# Patient Record
Sex: Female | Born: 1955 | Race: White | Hispanic: No | Marital: Married | Smoking: Current every day smoker
Health system: Southern US, Community
[De-identification: ages and names within clinical notes are randomized; demographics above are authoritative.]

## PROBLEM LIST (undated history)

## (undated) DIAGNOSIS — E119 Type 2 diabetes mellitus without complications: Secondary | ICD-10-CM

---

## 1983-04-25 HISTORY — PX: TUBAL LIGATION: SHX77

## 1992-04-24 HISTORY — PX: BREAST SURGERY: SHX581

## 2008-04-24 HISTORY — PX: CHOLECYSTECTOMY: SHX55

## 2018-01-05 ENCOUNTER — Other Ambulatory Visit: Payer: Self-pay

## 2018-01-05 ENCOUNTER — Emergency Department (HOSPITAL_COMMUNITY)
Admission: EM | Admit: 2018-01-05 | Discharge: 2018-01-06 | Disposition: A | Discharge status: Home / Self Care | Payer: BLUE CROSS/BLUE SHIELD | Attending: Emergency Medicine | Admitting: Emergency Medicine

## 2018-01-05 ENCOUNTER — Encounter (HOSPITAL_COMMUNITY): Payer: Self-pay | Admitting: Emergency Medicine

## 2018-01-05 DIAGNOSIS — W268XXA Contact with other sharp object(s), not elsewhere classified, initial encounter: Secondary | ICD-10-CM | POA: Insufficient documentation

## 2018-01-05 DIAGNOSIS — Y93E8 Activity, other personal hygiene: Secondary | ICD-10-CM | POA: Diagnosis not present

## 2018-01-05 DIAGNOSIS — Y92002 Bathroom of unspecified non-institutional (private) residence single-family (private) house as the place of occurrence of the external cause: Secondary | ICD-10-CM | POA: Insufficient documentation

## 2018-01-05 DIAGNOSIS — S71111A Laceration without foreign body, right thigh, initial encounter: Secondary | ICD-10-CM | POA: Diagnosis not present

## 2018-01-05 DIAGNOSIS — Y999 Unspecified external cause status: Secondary | ICD-10-CM | POA: Diagnosis not present

## 2018-01-05 DIAGNOSIS — Z23 Encounter for immunization: Secondary | ICD-10-CM | POA: Insufficient documentation

## 2018-01-05 DIAGNOSIS — Z79899 Other long term (current) drug therapy: Secondary | ICD-10-CM | POA: Diagnosis not present

## 2018-01-05 DIAGNOSIS — Z7984 Long term (current) use of oral hypoglycemic drugs: Secondary | ICD-10-CM | POA: Insufficient documentation

## 2018-01-05 DIAGNOSIS — E119 Type 2 diabetes mellitus without complications: Secondary | ICD-10-CM | POA: Diagnosis not present

## 2018-01-05 DIAGNOSIS — I83899 Varicose veins of unspecified lower extremities with other complications: Secondary | ICD-10-CM | POA: Diagnosis not present

## 2018-01-05 DIAGNOSIS — F17210 Nicotine dependence, cigarettes, uncomplicated: Secondary | ICD-10-CM | POA: Diagnosis not present

## 2018-01-05 DIAGNOSIS — S79921A Unspecified injury of right thigh, initial encounter: Secondary | ICD-10-CM | POA: Diagnosis present

## 2018-01-05 HISTORY — DX: Type 2 diabetes mellitus without complications: E11.9

## 2018-01-05 MED ORDER — TETANUS-DIPHTH-ACELL PERTUSSIS 5-2.5-18.5 LF-MCG/0.5 IM SUSP
0.5000 mL | Freq: Once | INTRAMUSCULAR | Status: AC
  Administered 2018-01-05: 0.5 mL via INTRAMUSCULAR
  Filled 2018-01-05: qty 0.5

## 2018-01-05 NOTE — ED Provider Notes (Signed)
[facility] Provider Note   CSN: [id number] Arrival date & time: 01/05/18  2213     History   Chief Complaint Chief Complaint  Patient presents with  . Extremity Laceration    HPI Adriana FellowsRebecca James is a 62 y.o. female.  HPI   Adriana James is a 62 y.o. female who presents to the Emergency Department complaining of bleeding from a varicose vein of her right thigh.  She states that she was shaving her legs tonight and "nicked" the area with the razor and was unable to control the bleeding at home.  She applied direct pressure with a towel which helped somewhat.  She denies pain, swelling.  Does not take blood thinners.  Last Td is unknown.     Past Medical History:  Diagnosis Date  . Diabetes mellitus without complication (HCC)     There are no active problems to display for this patient.   Past Surgical History:  Procedure Laterality Date  . BREAST SURGERY  1994   reduction   . CHOLECYSTECTOMY  2010  . TUBAL LIGATION  1985     OB History   None      Home Medications    Prior to Admission medications   Medication Sig Start Date End Date Taking? Authorizing Provider  glyBURIDE-metformin (GLUCOVANCE) 5-500 MG tablet Take 0.5 tablets by mouth 2 (two) times daily. 12/27/17  Yes [provider]  ketoconazole (NIZORAL) 2 % cream Apply 1 application topically daily as needed for irritation.  01/04/18  Yes [provider]  levothyroxine (SYNTHROID, LEVOTHROID) 75 MCG tablet Take 75 mcg by mouth daily before breakfast.  12/27/17  Yes [provider]  lisinopril (PRINIVIL,ZESTRIL) 2.5 MG tablet Take 2.5 mg by mouth daily. 12/27/17  Yes [provider]  LORazepam (ATIVAN) 1 MG tablet Take 1 mg by mouth 2 (two) times daily as needed. 10/16/17  Yes [provider]  pantoprazole (PROTONIX) 40 MG tablet Take 40 mg by mouth daily. 12/27/17  Yes [provider]  pravastatin (PRAVACHOL) 20 MG tablet Take 20 mg by mouth  daily. 12/27/17  Yes [provider]  SM ATHLETES FOOT 1 % cream Apply 1 application topically 2 (two) times daily as needed (for irritation).  11/08/17  Yes [provider]    Family History History reviewed. No pertinent family history.  Social History Social History   Tobacco Use  . Smoking status: Current Every Day Smoker    Packs/day: 1.00    Years: 25.00    Pack years: 25.00    Types: Cigarettes  . Smokeless tobacco: Never Used  Substance Use Topics  . Alcohol use: Yes    Comment: occ  . Drug use: Never     Allergies   Doxycycline   Review of Systems Review of Systems  Constitutional: Negative for fever.  Musculoskeletal: Negative for arthralgias, back pain and myalgias.  Skin: Positive for wound.       Laceration right thigh  Neurological: Negative for dizziness, weakness and numbness.  Hematological: Does not bruise/bleed easily.     Physical Exam Updated Vital Signs BP (!) 150/76 (BP Location: Right Arm)   Pulse (!) 104   Temp 97.8 F (36.6 C) (Oral)   Resp 20   Ht 5\' 2"  (1.575 m)   Wt 81.2 kg   SpO2 96%   BMI 32.74 kg/m   Physical Exam  Constitutional: She appears well-developed and well-nourished. No distress.  HENT:  Head: Atraumatic.  Cardiovascular: Normal rate  and regular rhythm.  Pulmonary/Chest: Effort normal and breath sounds normal. No respiratory distress.  Musculoskeletal: She exhibits no edema or tenderness.  Bleeding from a small varicose vein to the anterior right thigh.  No laceration  Neurological: She is alert. No sensory deficit.  Skin: Skin is warm. Capillary refill takes less than 2 seconds.  Nursing note and vitals reviewed.    ED Treatments / Results  Labs (all labs ordered are listed, but only abnormal results are displayed) Labs Reviewed - No data to display  EKG None  Radiology No results found.  Procedures Procedures (including critical care time)  Quick clot powder applied to the  bleeding site and pressure dressing applied.  Pt tolerated procedure well.    Medications Ordered in ED Medications  Tdap (BOOSTRIX) injection 0.5 mL (has no administration in time range)     Initial Impression / Assessment and Plan / ED Course  I have reviewed the triage vital signs and the nursing notes.  Pertinent labs & imaging results that were available during my care of the patient were reviewed by me and considered in my medical decision making (see chart for details).     Bleeding from a varicosity of the right upper leg, controlled with application of hemostatic powder.   Pt observed in the dept and hemostasis obtained.  Td updated.  She appears appropriate for d/c home.  Return precautions discussed.     Final Clinical Impressions(s) / ED Diagnoses   Final diagnoses:  Bleeding from varicose vein    ED Discharge Orders    None       Pauline Aus, PA-C 01/05/18 2350    Long, Arlyss Repress, MD 01/06/18 [phone]

## 2018-01-05 NOTE — Discharge Instructions (Addendum)
Keep the pressure dressing in place until tomorrow afternoon then gently remove the outer bandage and replace with a band-aid.  Elevate your leg when possible.  Return if needed

## 2018-01-05 NOTE — ED Triage Notes (Signed)
Pt states she cut a small vein on her upper right thigh shaving x 20 mins ago, bleeding controlled at this time

## 2020-03-23 ENCOUNTER — Encounter: Payer: Self-pay | Admitting: Emergency Medicine

## 2020-03-23 ENCOUNTER — Ambulatory Visit
Admission: EM | Admit: 2020-03-23 | Discharge: 2020-03-23 | Disposition: A | Discharge status: Home / Self Care | Payer: BC Managed Care – PPO | Attending: Emergency Medicine | Admitting: Emergency Medicine

## 2020-03-23 ENCOUNTER — Ambulatory Visit (INDEPENDENT_AMBULATORY_CARE_PROVIDER_SITE_OTHER): Payer: BC Managed Care – PPO

## 2020-03-23 ENCOUNTER — Other Ambulatory Visit: Payer: Self-pay

## 2020-03-23 DIAGNOSIS — M79644 Pain in right finger(s): Secondary | ICD-10-CM | POA: Diagnosis not present

## 2020-03-23 MED ORDER — PREDNISONE 10 MG PO TABS: 20.0000 mg | ORAL_TABLET | Freq: Every day | ORAL | 0 refills | Status: AC

## 2020-03-23 MED ORDER — ACETAMINOPHEN 500 MG PO TABS: 500.0000 mg | ORAL_TABLET | Freq: Four times a day (QID) | ORAL | 0 refills | Status: AC | PRN

## 2020-03-23 NOTE — ED Provider Notes (Addendum)
[facility]ARE [facility]ENTER   [id number] 03/23/20 Arrival Time: 1516   Chief Complaint  Patient presents with  . Finger Injury  '  SUBJECTIVE: History from: patient.  Adriana James is a 64 y.o. female who presented to the urgent care with a complaint of right thumb pain for the past 4 days.  Denies any precipitating event, trauma or injury.  She localized the pain to the right thumb.  She describes the pain as constant and achy.  She has tried OTC medications without relief.  Her symptoms are made worse with ROM.  She denies similar symptoms in the past.  Denies chills, fever, nausea, vomiting, diarrhea.    ROS: As per HPI.  All other pertinent ROS negative.      Past Medical History:  Diagnosis Date  . Diabetes mellitus without complication [facility])    Past Surgical History:  Procedure Laterality Date  . BREAST SURGERY  1994   reduction   . CHOLECYSTECTOMY  2010  . TUBAL LIGATION  1985   Allergies  Allergen Reactions  . Doxycycline Rash   No current facility-administered medications on file prior to encounter.   Current Outpatient Medications on File Prior to Encounter  Medication Sig Dispense Refill  . glyBURIDE-metformin (GLUCOVANCE) 5-500 MG tablet Take 0.5 tablets by mouth 2 (two) times daily.  12  . ketoconazole (NIZORAL) 2 % cream Apply 1 application topically daily as needed for irritation.   0  . levothyroxine (SYNTHROID, LEVOTHROID) 75 MCG tablet Take 75 mcg by mouth daily before breakfast.   4  . lisinopril (PRINIVIL,ZESTRIL) 2.5 MG tablet Take 2.5 mg by mouth daily.  4  . LORazepam (ATIVAN) 1 MG tablet Take 1 mg by mouth 2 (two) times daily as needed.  2  . pantoprazole (PROTONIX) 40 MG tablet Take 40 mg by mouth daily.  12  . pravastatin (PRAVACHOL) 20 MG tablet Take 20 mg by mouth daily.  1  . SM ATHLETES FOOT 1 % cream Apply 1 application topically 2 (two) times daily as needed (for irritation).   2   Social History   Socioeconomic History  . Marital status:  Married    Spouse name: Not on file  . Number of children: Not on file  . Years of education: Not on file  . Highest education level: Not on file  Occupational History  . Not on file  Tobacco Use  . Smoking status: Current Every Day Smoker    Packs/day: 1.00    Years: 25.00    Pack years: 25.00    Types: Cigarettes  . Smokeless tobacco: Never Used  Vaping Use  . Vaping Use: Never used  Substance and Sexual Activity  . Alcohol use: Yes    Comment: occ  . Drug use: Never  . Sexual activity: Not Currently  Other Topics Concern  . Not on file  Social History Narrative  . Not on file   Social Determinants of Health   Financial Resource Strain:   . Difficulty of Paying Living Expenses: Not on file  Food Insecurity:   . Worried About Programme researcher, broadcasting/film/video in the Last Year: Not on file  . Ran Out of Food in the Last Year: Not on file  Transportation Needs:   . Lack of Transportation (Medical): Not on file  . Lack of Transportation (Non-Medical): Not on file  Physical Activity:   . Days of Exercise per Week: Not on file  . Minutes of Exercise per Session: Not on file  Stress:   . Feeling of Stress : Not on file  Social Connections:   . Frequency of Communication with Friends and Family: Not on file  . Frequency of Social Gatherings with Friends and Family: Not on file  . Attends Religious Services: Not on file  . Active Member of Clubs or Organizations: Not on file  . Attends Banker Meetings: Not on file  . Marital Status: Not on file  Intimate Partner Violence:   . Fear of Current or Ex-Partner: Not on file  . Emotionally Abused: Not on file  . Physically Abused: Not on file  . Sexually Abused: Not on file   No family history on file.  OBJECTIVE:  Vitals:   03/23/20 1526  BP: 135/85  Pulse: 90  Resp: 16  Temp: 98.8 F (37.1 C)  SpO2: 96%     Physical Exam Vitals and nursing note reviewed.  Constitutional:      General: She is not in acute  distress.    Appearance: Normal appearance. She is normal weight. She is not ill-appearing, toxic-appearing or diaphoretic.  HENT:     Head: Normocephalic.  Cardiovascular:     Rate and Rhythm: Normal rate and regular rhythm.     Pulses: Normal pulses.     Heart sounds: Normal heart sounds. No murmur heard.  No friction rub. No gallop.   Pulmonary:     Effort: Pulmonary effort is normal. No respiratory distress.     Breath sounds: Normal breath sounds. No stridor. No wheezing, rhonchi or rales.  Chest:     Chest wall: No tenderness.  Musculoskeletal:        General: Tenderness present.     Right hand: Tenderness present.     Left hand: Normal.     Comments: The right hand is without any obvious asymmetry or deformity when compared to the left hand.  There is no ecchymosis, open wound, lesion, warmth present or subungual hematoma present.  Limited range of motion due to pain.  Neurovascular status intact.  Neurological:     Mental Status: She is alert and oriented to person, place, and time.     LABS:  No results found for this or any previous visit (from the past 24 hour(s)).   RADIOLOGY:  DG Finger Thumb Right  Result Date: 03/23/2020 CLINICAL DATA:  Acute right thumb pain without known injury. EXAM: RIGHT THUMB 2+V COMPARISON:  None. FINDINGS: There is no evidence of fracture or dislocation. There is no evidence of arthropathy or other focal bone abnormality. Soft tissues are unremarkable. IMPRESSION: Negative. Electronically Signed   By: Lupita Raider M.D.   On: 03/23/2020 16:16   Right thumb X-ray is negative for bony abnormality including fracture or dislocation.  I have reviewed the x-ray myself and the radiologist interpretation.  I am in agreement with the radiologist interpretation.   ASSESSMENT & PLAN:  1. Thumb pain, right     Meds ordered this encounter  Medications  . predniSONE (DELTASONE) 10 MG tablet    Sig: Take 2 tablets (20 mg total) by mouth daily  for 5 days.    Dispense:  10 tablet    Refill:  0    Discharge instructions  Prescribed prednisone/take as directed Continue to take OTC Tylenol as needed for pain Follow-up with PCP Follow RICE instruction that is attached Return or go to ED if you develop any new or worsening symptoms  Reviewed expectations re: course of current medical issues. Questions  answered. Outlined signs and symptoms indicating need for more acute intervention. Patient verbalized understanding. After Visit Summary given.         Durward Parcel, FNP 03/23/20 1629    Durward Parcel, FNP 03/23/20 1629

## 2020-03-23 NOTE — ED Triage Notes (Signed)
Patient has trigger finger issue on the right thumb x 4 days. Patient has some soft tissue swelling on inside of mouth too thats been there for months.

## 2020-03-23 NOTE — Discharge Instructions (Addendum)
Prescribed prednisone/take as directed  Continue to take OTC Tylenol as needed for pain Follow-up with PCP Follow RICE instruction that is attached Return or go to ED if you develop any new or worsening symptoms

## 2020-07-31 ENCOUNTER — Other Ambulatory Visit: Payer: Self-pay

## 2020-07-31 DIAGNOSIS — I83893 Varicose veins of bilateral lower extremities with other complications: Secondary | ICD-10-CM

## 2020-08-19 ENCOUNTER — Encounter: Payer: Self-pay | Admitting: Physician Assistant

## 2020-08-19 ENCOUNTER — Ambulatory Visit (INDEPENDENT_AMBULATORY_CARE_PROVIDER_SITE_OTHER): Payer: BC Managed Care – PPO | Admitting: Physician Assistant

## 2020-08-19 ENCOUNTER — Ambulatory Visit (HOSPITAL_COMMUNITY)
Admission: RE | Admit: 2020-08-19 | Discharge: 2020-08-19 | Disposition: A | Discharge status: Home / Self Care | Payer: BC Managed Care – PPO | Source: Ambulatory Visit | Attending: Vascular Surgery | Admitting: Vascular Surgery

## 2020-08-19 ENCOUNTER — Other Ambulatory Visit: Payer: Self-pay

## 2020-08-19 VITALS — BP 129/75 | HR 94 | Temp 98.6°F | Resp 20 | Ht 62.0 in | Wt 174.7 lb

## 2020-08-19 DIAGNOSIS — I868 Varicose veins of other specified sites: Secondary | ICD-10-CM | POA: Diagnosis not present

## 2020-08-19 DIAGNOSIS — I83893 Varicose veins of bilateral lower extremities with other complications: Secondary | ICD-10-CM | POA: Diagnosis present

## 2020-08-19 NOTE — Progress Notes (Signed)
[facility] [place]   Reason for referral: spider veins with an episode of bleeding after shaving her legs in 2019.    History of Present Illness  Adriana James is a 65 y.o. female who presents with chief complaint: swollen leg.  Patient notes, onset of problems in 2019..  The patient has had no history of DVT, positive history of varicose vein, no history of venous stasis ulcers, no history of  Lymphedema and no history of skin changes in lower legs.  There is no family history of venous disorders.  The patient has not used compression stockings in the past.  Past Medical History:  Diagnosis Date  . Diabetes mellitus without complication [facility])     Past Surgical History:  Procedure Laterality Date  . BREAST SURGERY  1994   reduction   . CHOLECYSTECTOMY  2010  . TUBAL LIGATION  1985    Social History   Socioeconomic History  . Marital status: Married    Spouse name: Not on file  . Number of children: 0  . Years of education: Not on file  . Highest education level: Not on file  Occupational History  . Not on file  Tobacco Use  . Smoking status: Current Every Day Smoker    Packs/day: 1.00    Years: 25.00    Pack years: 25.00    Types: Cigarettes  . Smokeless tobacco: Never Used  Vaping Use  . Vaping Use: Never used  Substance and Sexual Activity  . Alcohol use: Yes    Comment: occ  . Drug use: Never  . Sexual activity: Not Currently  Other Topics Concern  . Not on file  Social History Narrative  . Not on file   Social Determinants of Health   Financial Resource Strain: Not on file  Food Insecurity: Not on file  Transportation Needs: Not on file  Physical Activity: Not on file  Stress: Not on file  Social Connections: Not on file  Intimate Partner Violence: Not on file    No family history on file.  Current Outpatient Medications on File Prior to Visit  Medication Sig Dispense Refill  . acetaminophen (TYLENOL) 500 MG tablet Take 1  tablet (500 mg total) by mouth every 6 (six) hours as needed. 30 tablet 0  . glyBURIDE-metformin (GLUCOVANCE) 5-500 MG tablet Take 0.5 tablets by mouth 2 (two) times daily.  12  . ketoconazole (NIZORAL) 2 % cream Apply 1 application topically daily as needed for irritation.   0  . levothyroxine (SYNTHROID) 100 MCG tablet Take 100 mcg by mouth daily.    Marland Kitchen lisinopril (PRINIVIL,ZESTRIL) 2.5 MG tablet Take 2.5 mg by mouth daily.  4  . montelukast (SINGULAIR) 10 MG tablet Take 1 tablet by mouth daily.    . pantoprazole (PROTONIX) 40 MG tablet Take 40 mg by mouth daily.  12  . rosuvastatin (CRESTOR) 5 MG tablet Take 5 mg by mouth at bedtime.    Marland Kitchen SM ATHLETES FOOT 1 % cream Apply 1 application topically 2 (two) times daily as needed (for irritation).   2   No current facility-administered medications on file prior to visit.    Allergies as of 08/19/2020 - Review Complete 08/19/2020  Allergen Reaction Noted  . Doxycycline Rash 01/05/2018     ROS:   General:  No weight loss, Fever, chills  HEENT: No recent headaches, no nasal bleeding, no visual changes, no sore throat  Neurologic: No dizziness, blackouts, seizures. No recent symptoms of stroke  or mini- stroke. No recent episodes of slurred speech, or temporary blindness.  Cardiac: No recent episodes of chest pain/pressure, no shortness of breath at rest.  No shortness of breath with exertion.  Denies history of atrial fibrillation or irregular heartbeat  Vascular: No history of rest pain in feet.  No history of claudication.  No history of non-healing ulcer, No history of DVT   Pulmonary: No home oxygen, no productive cough, no hemoptysis,  No asthma or wheezing  Musculoskeletal:  [ ]  Arthritis, [ ]  Low back pain,  [ ]  Joint pain  Hematologic:No history of hypercoagulable state.  No history of easy bleeding.  No history of anemia  Gastrointestinal: No hematochezia or melena,  No gastroesophageal reflux, no trouble swallowing  Urinary:  [ ]  chronic Kidney disease, [ ]  on HD - [ ]  MWF or [ ]  TTHS, [ ]  Burning with urination, [ ]  Frequent urination, [ ]  Difficulty urinating;   Skin: No rashes  Psychological: No history of anxiety,  No history of depression  Physical Examination  Vitals:   08/19/20 1341  BP: 129/75  Pulse: 94  Resp: 20  Temp: 98.6 F (37 C)  TempSrc: Temporal  SpO2: 96%  Weight: 174 lb 11.2 oz (79.2 kg)  Height: 5\' 2"  (1.575 m)    Body mass index is 31.95 kg/m.  General:  Alert and oriented, no acute distress HEENT: Normal Neck: No bruit or JVD Pulmonary: Clear to auscultation bilaterally Cardiac: Regular Rate and Rhythm without murmur Abdomen: Soft, non-tender, non-distended, no mass, no scars Skin: No rash Musculoskeletal: No deformity or edema  Neurologic: Upper and lower extremity motor 5/5 and symmetric  DATA:   Venous Reflux Times  +--------------+---------+------+-----------+------------+--------+  RIGHT     Reflux NoRefluxReflux TimeDiameter cmsComments               Yes                   +--------------+---------+------+-----------+------------+--------+  CFV      no                         +--------------+---------+------+-----------+------------+--------+  FV mid    no                         +--------------+---------+------+-----------+------------+--------+  Popliteal   no                         +--------------+---------+------+-----------+------------+--------+  GSV at [facility]  no               0.79        +--------------+---------+------+-----------+------------+--------+  GSV prox thighno               0.34        +--------------+---------+------+-----------+------------+--------+  GSV mid thigh no               0.19         +--------------+---------+------+-----------+------------+--------+  GSV dist thighno               0.22        +--------------+---------+------+-----------+------------+--------+  GSV at knee  no               0.21        +--------------+---------+------+-----------+------------+--------+  GSV prox calf       yes  >500 ms   0.23        +--------------+---------+------+-----------+------------+--------+  SSV  Pop Fossa no               0.18        +--------------+---------+------+-----------+------------+--------+  SSV prox calf no               0.33        +--------------+---------+------+-----------+------------+--------+  SSV mid calf no               0.26        +--------------+---------+------+-----------+------------+--------+     +--------------+---------+------+-----------+------------+--------+  LEFT     Reflux NoRefluxReflux TimeDiameter cmsComments               Yes                   +--------------+---------+------+-----------+------------+--------+  CFV      no                         +--------------+---------+------+-----------+------------+--------+  FV mid    no                         +--------------+---------+------+-----------+------------+--------+  Popliteal   no                         +--------------+---------+------+-----------+------------+--------+  [place] [facility]  no               0.75        +--------------+---------+------+-----------+------------+--------+  GSV prox thighno               0.44        +--------------+---------+------+-----------+------------+--------+  GSV mid thigh       yes  >500 ms    0.3         +--------------+---------+------+-----------+------------+--------+  GSV dist thigh      yes  >500 ms   0.23        +--------------+---------+------+-----------+------------+--------+  GSV at knee  no               0.2         +--------------+---------+------+-----------+------------+--------+  GSV prox calf       yes  >500 ms   0.24        +--------------+---------+------+-----------+------------+--------+  SSV Pop Fossa no               0.18        +--------------+---------+------+-----------+------------+--------+  SSV prox calf no               0.19        +--------------+---------+------+-----------+------------+--------+  SSV mid calf no               0.16        +--------------+---------+------+-----------+------------+--------+    Summary:  Right:  - No evidence of deep vein thrombosis seen in the right lower extremity,  from the common femoral through the popliteal veins.  - No evidence of superficial venous thrombosis in the right lower  extremity.  - Venous reflux is noted in the right greater saphenous vein in the calf.    Left:  - No evidence of deep vein thrombosis seen in the left lower extremity,  from the common femoral through the popliteal veins.  - No evidence of superficial venous thrombosis in the left lower  extremity.  - Venous reflux is noted in the left greater saphenous vein in the  mid-distal thigh.  - Venous reflux is noted in the left greater saphenous vein  in the calf.    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  She has had an episode or 2 of bleeding after shaving her legs on the right anterior thigh.  The right prox calf GSV has mild reflux and the left mid/distal thigh has reflux.  There is no SFJ reflux and no chronic skin changes from venous  insufficiencies.  She has palpable pedal pulses and is not at risk of limb loss.      Plan:  If she develops LE edema, venous ulcers, or weeping with reflux evidence at the [facility] and vein size > 0.4 in the GSV she would qualify for intervention.  She could have sclero therapy to the reticular veins on the surface of her thighs, but this is considered cosmetic and not covered by insurance.   She should continue to stay active, avoid prolonged sitting and standing.  Elevation of LE when at rest.  F/U PRN  Mosetta Pigeon PA-C [facility] [place] Office: [phone]  MD in office [place]

## 2021-04-28 IMAGING — DX DG FINGER THUMB 2+V*R*
3 series · 3 of 3 positions shown · non-contrast
Comparison: None.

CLINICAL DATA: Acute right thumb pain without known injury.

EXAM:
RIGHT THUMB 2+V

[thumb mlo]
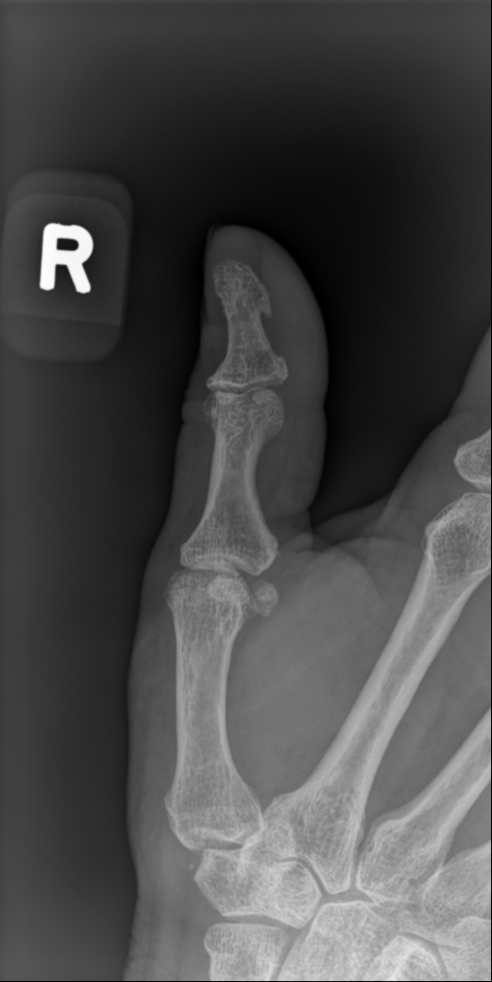

[thumb lat]
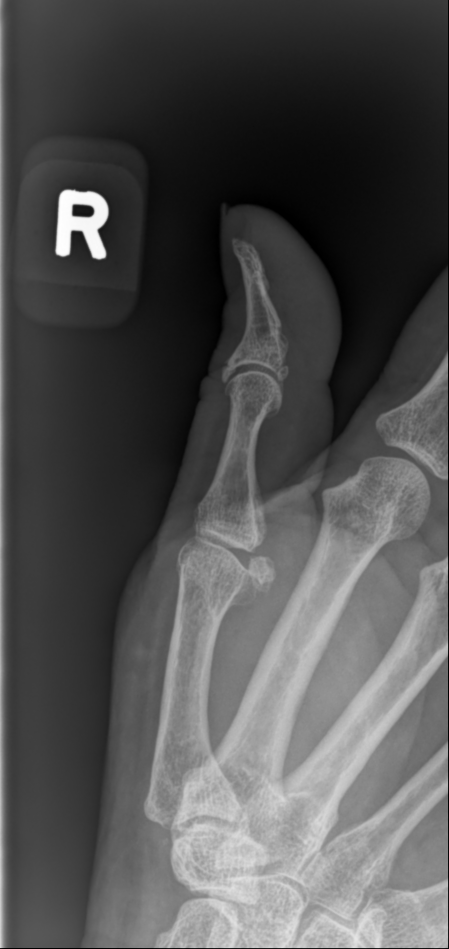

[thumb ap]
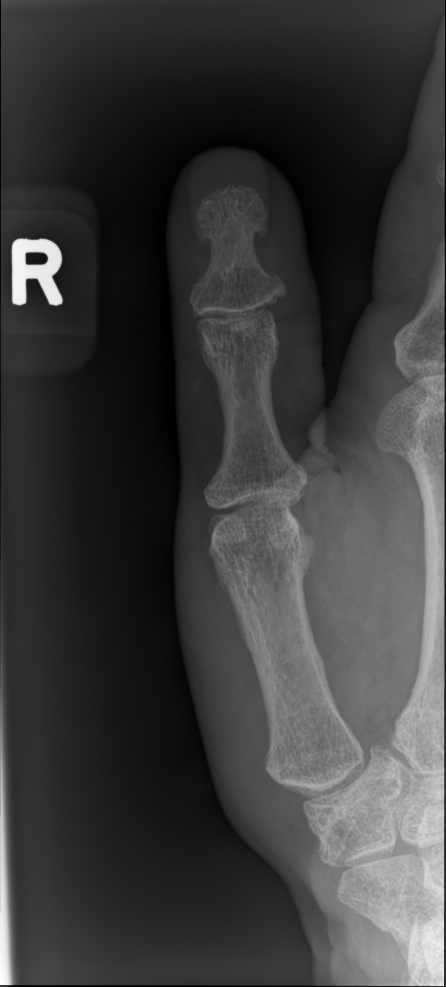

[3 of 3 positions shown; findings below may reference images not displayed]

FINDINGS: There is no evidence of fracture or dislocation. There is no
evidence of arthropathy or other focal bone abnormality. Soft
tissues are unremarkable.
IMPRESSION: Negative.

## 2021-08-09 ENCOUNTER — Other Ambulatory Visit: Payer: Self-pay | Admitting: Family Medicine

## 2021-08-10 ENCOUNTER — Other Ambulatory Visit: Payer: Self-pay | Admitting: Family Medicine

## 2021-08-10 ENCOUNTER — Other Ambulatory Visit (HOSPITAL_COMMUNITY): Payer: Self-pay | Admitting: Family Medicine

## 2021-08-10 DIAGNOSIS — F17200 Nicotine dependence, unspecified, uncomplicated: Secondary | ICD-10-CM
